# Patient Record
Sex: Male | Born: 1945 | Race: White | Hispanic: No | Marital: Single
Health system: Southern US, Community
[De-identification: ages and names within clinical notes are randomized; demographics above are authoritative.]

---

## 2017-02-01 ENCOUNTER — Inpatient Hospital Stay
Admission: EM | Admit: 2017-02-01 | Discharge: 2017-03-12 | Disposition: A | Discharge status: Skilled Nursing Facility | Payer: Self-pay | Source: Other Acute Inpatient Hospital | Attending: Internal Medicine | Admitting: Internal Medicine

## 2017-02-01 DIAGNOSIS — Z0189 Encounter for other specified special examinations: Secondary | ICD-10-CM

## 2017-02-01 DIAGNOSIS — J9600 Acute respiratory failure, unspecified whether with hypoxia or hypercapnia: Secondary | ICD-10-CM

## 2017-02-01 DIAGNOSIS — R0603 Acute respiratory distress: Secondary | ICD-10-CM

## 2017-02-01 DIAGNOSIS — Z431 Encounter for attention to gastrostomy: Secondary | ICD-10-CM

## 2017-02-01 DIAGNOSIS — Z4659 Encounter for fitting and adjustment of other gastrointestinal appliance and device: Secondary | ICD-10-CM

## 2017-02-01 DIAGNOSIS — R319 Hematuria, unspecified: Secondary | ICD-10-CM

## 2017-02-01 DIAGNOSIS — Z452 Encounter for adjustment and management of vascular access device: Secondary | ICD-10-CM

## 2017-02-01 DIAGNOSIS — R1310 Dysphagia, unspecified: Secondary | ICD-10-CM

## 2017-02-01 DIAGNOSIS — Z931 Gastrostomy status: Secondary | ICD-10-CM

## 2017-02-01 DIAGNOSIS — H6990 Unspecified Eustachian tube disorder, unspecified ear: Secondary | ICD-10-CM

## 2017-02-02 ENCOUNTER — Other Ambulatory Visit (HOSPITAL_COMMUNITY): Payer: Self-pay

## 2017-02-02 LAB — COMPREHENSIVE METABOLIC PANEL
ALBUMIN: 2.4 g/dL — AB (ref 3.5–5.0)
ALT: 6 U/L — ABNORMAL LOW (ref 17–63)
ANION GAP: 6 (ref 5–15)
AST: 35 U/L (ref 15–41)
Alkaline Phosphatase: 140 U/L — ABNORMAL HIGH (ref 38–126)
BUN: 20 mg/dL (ref 6–20)
CO2: 32 mmol/L (ref 22–32)
Calcium: 8.3 mg/dL — ABNORMAL LOW (ref 8.9–10.3)
Chloride: 113 mmol/L — ABNORMAL HIGH (ref 101–111)
Creatinine, Ser: 0.75 mg/dL (ref 0.61–1.24)
GFR calc Af Amer: >60 mL/min (ref >60)
GFR calc non Af Amer: >60 mL/min (ref >60)
GLUCOSE: 143 mg/dL — AB (ref 65–99)
POTASSIUM: 3.3 mmol/L — AB (ref 3.5–5.1)
SODIUM: 151 mmol/L — AB (ref 135–145)
TOTAL PROTEIN: 6.1 g/dL — AB (ref 6.5–8.1)
Total Bilirubin: 1.3 mg/dL — ABNORMAL HIGH (ref 0.3–1.2)

## 2017-02-02 LAB — CBC
HCT: 25.7 % — ABNORMAL LOW (ref 39.0–52.0)
Hemoglobin: 7.4 g/dL — ABNORMAL LOW (ref 13.0–17.0)
MCH: 28 pg (ref 26.0–34.0)
MCHC: 28.8 g/dL — ABNORMAL LOW (ref 30.0–36.0)
MCV: 97.3 fL (ref 78.0–100.0)
Platelets: 276 10*3/uL (ref 150–400)
RBC: 2.64 MIL/uL — AB (ref 4.22–5.81)
RDW: 16.4 % — ABNORMAL HIGH (ref 11.5–15.5)
WBC: 7.3 10*3/uL (ref 4.0–10.5)

## 2017-02-02 LAB — BRAIN NATRIURETIC PEPTIDE: B Natriuretic Peptide: 80.5 pg/mL (ref 0.0–100.0)

## 2017-02-04 LAB — BASIC METABOLIC PANEL
Anion gap: 9 (ref 5–15)
BUN: 22 mg/dL — AB (ref 6–20)
CHLORIDE: 111 mmol/L (ref 101–111)
CO2: 29 mmol/L (ref 22–32)
CREATININE: 0.76 mg/dL (ref 0.61–1.24)
Calcium: 8.1 mg/dL — ABNORMAL LOW (ref 8.9–10.3)
GFR calc Af Amer: >60 mL/min (ref >60)
GFR calc non Af Amer: >60 mL/min (ref >60)
GLUCOSE: 188 mg/dL — AB (ref 65–99)
Potassium: 2.9 mmol/L — ABNORMAL LOW (ref 3.5–5.1)
SODIUM: 149 mmol/L — AB (ref 135–145)

## 2017-02-04 LAB — CBC
HCT: 29 % — ABNORMAL LOW (ref 39.0–52.0)
Hemoglobin: 8.3 g/dL — ABNORMAL LOW (ref 13.0–17.0)
MCH: 28.2 pg (ref 26.0–34.0)
MCHC: 28.6 g/dL — AB (ref 30.0–36.0)
MCV: 98.6 fL (ref 78.0–100.0)
PLATELETS: 311 10*3/uL (ref 150–400)
RBC: 2.94 MIL/uL — ABNORMAL LOW (ref 4.22–5.81)
RDW: 17 % — AB (ref 11.5–15.5)
WBC: 10.5 10*3/uL (ref 4.0–10.5)

## 2017-02-06 ENCOUNTER — Encounter: Payer: Self-pay | Admitting: Anesthesiology

## 2017-02-06 ENCOUNTER — Other Ambulatory Visit (HOSPITAL_COMMUNITY): Payer: Self-pay

## 2017-02-06 LAB — BLOOD GAS, ARTERIAL
ACID-BASE EXCESS: 4.3 mmol/L — AB (ref 0.0–2.0)
Acid-Base Excess: 3.7 mmol/L — ABNORMAL HIGH (ref 0.0–2.0)
BICARBONATE: 26.9 mmol/L (ref 20.0–28.0)
Bicarbonate: 27.9 mmol/L (ref 20.0–28.0)
FIO2: 60
FIO2: 40
LHR: 18 {breaths}/min
MECHVT: 500 mL
O2 Content: 25 L/min
O2 SAT: 93.8 %
O2 SAT: 99.1 %
PATIENT TEMPERATURE: 103.7
PATIENT TEMPERATURE: 98.6
PCO2 ART: 35.4 mmHg (ref 32.0–48.0)
PCO2 ART: 43.5 mmHg (ref 32.0–48.0)
PEEP: 5 cmH2O
PH ART: 7.505 — AB (ref 7.350–7.450)
PH ART: 7.423 (ref 7.350–7.450)
PO2 ART: 74.1 mmHg — AB (ref 83.0–108.0)
PO2 ART: 139 mmHg — AB (ref 83.0–108.0)

## 2017-02-06 LAB — CBC
HCT: 25.1 % — ABNORMAL LOW (ref 39.0–52.0)
HEMOGLOBIN: 7.2 g/dL — AB (ref 13.0–17.0)
MCH: 29 pg (ref 26.0–34.0)
MCHC: 28.7 g/dL — AB (ref 30.0–36.0)
MCV: 101.2 fL — ABNORMAL HIGH (ref 78.0–100.0)
Platelets: 266 10*3/uL (ref 150–400)
RBC: 2.48 MIL/uL — AB (ref 4.22–5.81)
RDW: 17.6 % — ABNORMAL HIGH (ref 11.5–15.5)
WBC: 8.1 10*3/uL (ref 4.0–10.5)

## 2017-02-06 LAB — BLOOD CULTURE ID PANEL (REFLEXED)
Acinetobacter baumannii: NOT DETECTED
CANDIDA KRUSEI: NOT DETECTED
CARBAPENEM RESISTANCE: NOT DETECTED
Candida albicans: NOT DETECTED
Candida glabrata: NOT DETECTED
Candida parapsilosis: NOT DETECTED
Candida tropicalis: NOT DETECTED
ENTEROCOCCUS SPECIES: NOT DETECTED
Enterobacter cloacae complex: NOT DETECTED
Enterobacteriaceae species: DETECTED — AB
Escherichia coli: NOT DETECTED
HAEMOPHILUS INFLUENZAE: NOT DETECTED
KLEBSIELLA OXYTOCA: NOT DETECTED
Klebsiella pneumoniae: DETECTED — AB
LISTERIA MONOCYTOGENES: NOT DETECTED
NEISSERIA MENINGITIDIS: NOT DETECTED
PSEUDOMONAS AERUGINOSA: NOT DETECTED
Proteus species: NOT DETECTED
SERRATIA MARCESCENS: NOT DETECTED
STAPHYLOCOCCUS AUREUS BCID: NOT DETECTED
STAPHYLOCOCCUS SPECIES: NOT DETECTED
STREPTOCOCCUS PNEUMONIAE: NOT DETECTED
STREPTOCOCCUS PYOGENES: NOT DETECTED
STREPTOCOCCUS SPECIES: NOT DETECTED
Streptococcus agalactiae: NOT DETECTED

## 2017-02-06 LAB — LACTIC ACID, PLASMA: Lactic Acid, Venous: 2.1 mmol/L (ref 0.5–1.9)

## 2017-02-06 LAB — BASIC METABOLIC PANEL
Anion gap: 5 (ref 5–15)
BUN: 26 mg/dL — ABNORMAL HIGH (ref 6–20)
CHLORIDE: 120 mmol/L — AB (ref 101–111)
CO2: 27 mmol/L (ref 22–32)
CREATININE: 0.73 mg/dL (ref 0.61–1.24)
Calcium: 7.7 mg/dL — ABNORMAL LOW (ref 8.9–10.3)
GFR calc Af Amer: >60 mL/min (ref >60)
Glucose, Bld: 201 mg/dL — ABNORMAL HIGH (ref 65–99)
Potassium: 3.6 mmol/L (ref 3.5–5.1)
SODIUM: 152 mmol/L — AB (ref 135–145)

## 2017-02-06 LAB — URINALYSIS, ROUTINE W REFLEX MICROSCOPIC
Bilirubin Urine: NEGATIVE
Glucose, UA: NEGATIVE mg/dL
KETONES UR: NEGATIVE mg/dL
NITRITE: POSITIVE — AB
PROTEIN: 30 mg/dL — AB
Specific Gravity, Urine: 1.027 (ref 1.005–1.030)
pH: 7 (ref 5.0–8.0)

## 2017-02-06 MED ORDER — PROPOFOL 10 MG/ML IV BOLUS
INTRAVENOUS | Status: DC | PRN
  Administered 2017-02-06: 80 mg via INTRAVENOUS

## 2017-02-06 NOTE — Anesthesia Procedure Notes (Signed)
Procedure Name: Intubation Date/Time: 02/06/2017 6:57 AM Performed by: Claudina LickMahony, Jeanne Diefendorf D, CRNA Pre-anesthesia Checklist: Patient identified, Emergency Drugs available, Suction available, Patient being monitored and Timeout performed Patient Re-evaluated:Patient Re-evaluated prior to induction Oxygen Delivery Method: Non-rebreather mask Preoxygenation: Pre-oxygenation with 100% oxygen (Cervical collar in situ. Head/neck maintained in neutral position) Induction Type: IV induction Laryngoscope Size: Glidescope Grade View: Grade I Tube type: Subglottic suction tube Tube size: 7.5 mm Number of attempts: 1 Airway Equipment and Method: Stylet Placement Confirmation: ETT inserted through vocal cords under direct vision,  CO2 detector and breath sounds checked- equal and bilateral Secured at: 23 cm Tube secured with: Tape Dental Injury: Teeth and Oropharynx as per pre-operative assessment

## 2017-02-06 NOTE — Transfer of Care (Signed)
Immediate Anesthesia Transfer of Care Note  Patient: Mark Douglas  Procedure(s) Performed: AN AD HOC INTUBATION  Patient Location: Select unit  Anesthesia Type:General  Level of Consciousness: obtunded  Airway & Oxygen Therapy: Patient remains intubated per anesthesia plan and Patient placed on Ventilator (see vital sign flow sheet for setting)  Post-op Assessment: Report given to RN and Post -op Vital signs reviewed and stable  Post vital signs: Reviewed and stable  Last Vitals: There were no vitals filed for this visit.  Last Pain: There were no vitals filed for this visit.       Complications: No apparent anesthesia complications

## 2017-02-07 ENCOUNTER — Other Ambulatory Visit (HOSPITAL_COMMUNITY): Payer: Self-pay

## 2017-02-07 LAB — VANCOMYCIN, TROUGH: VANCOMYCIN TR: 15 ug/mL (ref 15–20)

## 2017-02-08 ENCOUNTER — Other Ambulatory Visit (HOSPITAL_COMMUNITY): Payer: Self-pay

## 2017-02-08 LAB — URINE CULTURE: Culture: >=100000 — AB

## 2017-02-08 LAB — POTASSIUM: Potassium: 3.6 mmol/L (ref 3.5–5.1)
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
## 2017-02-09 ENCOUNTER — Encounter: Payer: Self-pay | Admitting: Certified Registered"

## 2017-02-09 ENCOUNTER — Other Ambulatory Visit (HOSPITAL_COMMUNITY): Payer: Self-pay

## 2017-02-09 ENCOUNTER — Encounter (HOSPITAL_COMMUNITY): Payer: Self-pay | Admitting: Anesthesiology

## 2017-02-09 ENCOUNTER — Encounter
Admission: EM | Disposition: A | Discharge status: Skilled Nursing Facility | Payer: Self-pay | Source: Other Acute Inpatient Hospital | Attending: Internal Medicine

## 2017-02-09 HISTORY — PX: TRACHEOSTOMY TUBE PLACEMENT: SHX814

## 2017-02-09 LAB — CULTURE, BLOOD (ROUTINE X 2)

## 2017-02-09 LAB — CBC
HCT: 26.8 % — ABNORMAL LOW (ref 39.0–52.0)
Hemoglobin: 7.2 g/dL — ABNORMAL LOW (ref 13.0–17.0)
MCH: 27.5 pg (ref 26.0–34.0)
MCHC: 26.9 g/dL — AB (ref 30.0–36.0)
MCV: 102.3 fL — ABNORMAL HIGH (ref 78.0–100.0)
PLATELETS: 386 10*3/uL (ref 150–400)
RBC: 2.62 MIL/uL — ABNORMAL LOW (ref 4.22–5.81)
RDW: 16.5 % — AB (ref 11.5–15.5)
WBC: 9.9 10*3/uL (ref 4.0–10.5)

## 2017-02-09 LAB — CULTURE, RESPIRATORY W GRAM STAIN

## 2017-02-09 LAB — BASIC METABOLIC PANEL
ANION GAP: 11 (ref 5–15)
BUN: 21 mg/dL — ABNORMAL HIGH (ref 6–20)
CALCIUM: 8.1 mg/dL — AB (ref 8.9–10.3)
CO2: 23 mmol/L (ref 22–32)
CREATININE: 0.78 mg/dL (ref 0.61–1.24)
Chloride: 123 mmol/L — ABNORMAL HIGH (ref 101–111)
GFR calc Af Amer: >60 mL/min (ref >60)
Glucose, Bld: 134 mg/dL — ABNORMAL HIGH (ref 65–99)
Potassium: 3.7 mmol/L (ref 3.5–5.1)
Sodium: 157 mmol/L — ABNORMAL HIGH (ref 135–145)

## 2017-02-09 LAB — CULTURE, RESPIRATORY

## 2017-02-09 SURGERY — CREATION, TRACHEOSTOMY
Anesthesia: General | Site: Neck

## 2017-02-09 MED ORDER — PHENYLEPHRINE HCL 10 MG/ML IJ SOLN
INTRAVENOUS | Status: DC | PRN
  Administered 2017-02-09: 50 ug/min via INTRAVENOUS

## 2017-02-09 MED ORDER — FENTANYL CITRATE (PF) 100 MCG/2ML IJ SOLN
INTRAMUSCULAR | Status: DC | PRN
  Administered 2017-02-09: 50 ug via INTRAVENOUS
  Administered 2017-02-09 (×2): 25 ug via INTRAVENOUS
  Administered 2017-02-09: 50 ug via INTRAVENOUS

## 2017-02-09 MED ORDER — PHENYLEPHRINE HCL 10 MG/ML IJ SOLN
INTRAMUSCULAR | Status: DC | PRN
  Administered 2017-02-09: 80 ug via INTRAVENOUS

## 2017-02-09 MED ORDER — LIDOCAINE-EPINEPHRINE 1 %-1:100000 IJ SOLN
INTRAMUSCULAR | Status: DC | PRN
  Administered 2017-02-09: 7 mL

## 2017-02-09 MED ORDER — ROCURONIUM BROMIDE 100 MG/10ML IV SOLN
INTRAVENOUS | Status: DC | PRN
  Administered 2017-02-09: 50 mg via INTRAVENOUS

## 2017-02-09 MED ORDER — LACTATED RINGERS IV SOLN
INTRAVENOUS | Status: DC | PRN
  Administered 2017-02-09: 08:00:00 via INTRAVENOUS

## 2017-02-09 MED ORDER — PROPOFOL 10 MG/ML IV BOLUS
INTRAVENOUS | Status: AC
  Filled 2017-02-09: qty 20

## 2017-02-09 MED ORDER — FENTANYL CITRATE (PF) 250 MCG/5ML IJ SOLN
INTRAMUSCULAR | Status: AC
  Filled 2017-02-09: qty 5

## 2017-02-09 MED ORDER — 0.9 % SODIUM CHLORIDE (POUR BTL) OPTIME
TOPICAL | Status: DC | PRN
  Administered 2017-02-09: 1000 mL

## 2017-02-09 SURGICAL SUPPLY — 42 items
ATTRACTOMAT 16X20 MAGNETIC DRP (DRAPES) ×3 IMPLANT
BLADE SURG 15 STRL LF DISP TIS (BLADE) ×1 IMPLANT
BLADE SURG 15 STRL SS (BLADE) ×2
CLEANER TIP ELECTROSURG 2X2 (MISCELLANEOUS) ×3 IMPLANT
COVER SURGICAL LIGHT HANDLE (MISCELLANEOUS) ×3 IMPLANT
DRAPE HALF SHEET 40X57 (DRAPES) ×3 IMPLANT
ELECT COATED BLADE 2.86 ST (ELECTRODE) ×3 IMPLANT
ELECT REM PT RETURN 9FT ADLT (ELECTROSURGICAL) ×3
ELECTRODE REM PT RTRN 9FT ADLT (ELECTROSURGICAL) ×1 IMPLANT
GAUZE SPONGE 4X4 16PLY XRAY LF (GAUZE/BANDAGES/DRESSINGS) ×3 IMPLANT
GEL ULTRASOUND 20GR AQUASONIC (MISCELLANEOUS) ×3 IMPLANT
GLOVE BIOGEL PI IND STRL 7.0 (GLOVE) ×2 IMPLANT
GLOVE BIOGEL PI INDICATOR 7.0 (GLOVE) ×4
GLOVE SS BIOGEL STRL SZ 7.5 (GLOVE) ×1 IMPLANT
GLOVE SUPERSENSE BIOGEL SZ 7.5 (GLOVE) ×2
GLOVE SURG SS PI 6.5 STRL IVOR (GLOVE) ×6 IMPLANT
GLOVE SURG SS PI 7.5 STRL IVOR (GLOVE) ×3 IMPLANT
GOWN STRL REUS W/ TWL LRG LVL3 (GOWN DISPOSABLE) ×1 IMPLANT
GOWN STRL REUS W/ TWL XL LVL3 (GOWN DISPOSABLE) ×1 IMPLANT
GOWN STRL REUS W/TWL LRG LVL3 (GOWN DISPOSABLE) ×2
GOWN STRL REUS W/TWL XL LVL3 (GOWN DISPOSABLE) ×2
HOLDER TRACH TUBE VELCRO 19.5 (MISCELLANEOUS) ×3 IMPLANT
KIT BASIN OR (CUSTOM PROCEDURE TRAY) ×3 IMPLANT
KIT ROOM TURNOVER OR (KITS) ×3 IMPLANT
KIT SUCTION CATH 14FR (SUCTIONS) ×3 IMPLANT
NEEDLE HYPO 25GX1X1/2 BEV (NEEDLE) ×3 IMPLANT
NS IRRIG 1000ML POUR BTL (IV SOLUTION) ×3 IMPLANT
PACK EENT II TURBAN DRAPE (CUSTOM PROCEDURE TRAY) ×3 IMPLANT
PAD ARMBOARD 7.5X6 YLW CONV (MISCELLANEOUS) ×3 IMPLANT
PENCIL BUTTON HOLSTER BLD 10FT (ELECTRODE) ×3 IMPLANT
SPONGE DRAIN TRACH 4X4 STRL 2S (GAUZE/BANDAGES/DRESSINGS) ×3 IMPLANT
SPONGE INTESTINAL PEANUT (DISPOSABLE) ×3 IMPLANT
SUT SILK 2 0 SH CR/8 (SUTURE) ×3 IMPLANT
SUT SILK 3 0 TIES 10X30 (SUTURE) IMPLANT
SYR 5ML LUER SLIP (SYRINGE) ×3 IMPLANT
SYR CONTROL 10ML LL (SYRINGE) ×3 IMPLANT
TOWEL OR 17X24 6PK STRL BLUE (TOWEL DISPOSABLE) ×3 IMPLANT
TOWEL OR 17X26 10 PK STRL BLUE (TOWEL DISPOSABLE) ×3 IMPLANT
TUBE CONNECTING 12'X1/4 (SUCTIONS) ×1
TUBE CONNECTING 12X1/4 (SUCTIONS) ×2 IMPLANT
TUBE TRACH SHILEY  6 DIST  CUF (TUBING) ×3 IMPLANT
TUBE TRACH SHILEY 8 DIST CUF (TUBING) IMPLANT

## 2017-02-09 NOTE — OR Nursing (Signed)
Changed patient bed linen. Gave patient a bath. Inserted foley per Dr. Ezzard StandingNewman. Re-dressed the ace wrap to bilateral legs. Replaced C-Collar with new one.

## 2017-02-09 NOTE — Anesthesia Procedure Notes (Addendum)
Date/Time: 02/09/2017 7:57 AM Performed by: Caren Macadamarter, Cherlyn Syring W, CRNA Pre-anesthesia Checklist: Patient identified, Emergency Drugs available, Suction available, Patient being monitored and Timeout performed Patient Re-evaluated:Patient Re-evaluated prior to induction Oxygen Delivery Method: Circle system utilized Preoxygenation: Pre-oxygenation with 100% oxygen Induction Type: Inhalational induction with existing ETT Tube size: 7.5 mm Placement Confirmation: positive ETCO2

## 2017-02-09 NOTE — Transfer of Care (Signed)
Immediate Anesthesia Transfer of Care Note  Patient: Mark Douglas  Procedure(s) Performed: TRACHEOSTOMY (N/A Neck)  Patient Location: select  Anesthesia Type:General  Level of Consciousness: drowsy  Airway & Oxygen Therapy: Patient placed on Ventilator (see vital sign flow sheet for setting)  Post-op Assessment: Report given to RN and Post -op Vital signs reviewed and stable  Post vital signs: Reviewed and stable  Last Vitals: There were no vitals filed for this visit.  Last Pain: There were no vitals filed for this visit.       Complications: No apparent anesthesia complications

## 2017-02-09 NOTE — Brief Op Note (Signed)
02/09/2017  9:58 AM  PATIENT:  Estrella MyrtleWallace Jock  72 y.o. male  PRE-OPERATIVE DIAGNOSIS:  respiratory failure  POST-OPERATIVE DIAGNOSIS:  respiratory failure  PROCEDURE:  Procedure(s): TRACHEOSTOMY (N/A) # 6 Shiley  SURGEON:  Surgeon(s) and Role:    * Drema HalonNewman, Christopher E, MD - Primary  PHYSICIAN ASSISTANT:   ASSISTANTS: none   ANESTHESIA:   general  EBL:  5 mL   BLOOD ADMINISTERED:none  DRAINS: none   LOCAL MEDICATIONS USED:  XYLOCAINE with epi 6 cc  SPECIMEN:  No Specimen  DISPOSITION OF SPECIMEN:  N/A  COUNTS:  YES  TOURNIQUET:  * No tourniquets in log *  DICTATION: .Other Dictation: Dictation Number [id number]  PLAN OF CARE: Discharge to home after PACU  PATIENT DISPOSITION:  PACU - hemodynamically stable.   Delay start of Pharmacological VTE agent (>24hrs) due to surgical blood loss or risk of bleeding: yes
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
## 2017-02-11 LAB — C DIFFICILE QUICK SCREEN W PCR REFLEX
C DIFFICLE (CDIFF) ANTIGEN: POSITIVE — AB
C Diff toxin: NEGATIVE

## 2017-02-11 LAB — CLOSTRIDIUM DIFFICILE BY PCR, REFLEXED: Toxigenic C. Difficile by PCR: NEGATIVE

## 2017-02-12 ENCOUNTER — Encounter (HOSPITAL_COMMUNITY): Payer: Self-pay | Admitting: Otolaryngology

## 2017-02-12 ENCOUNTER — Other Ambulatory Visit (HOSPITAL_COMMUNITY): Payer: Self-pay
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
## 2017-02-13 ENCOUNTER — Encounter: Payer: Self-pay | Admitting: General Surgery

## 2017-02-13 ENCOUNTER — Other Ambulatory Visit (HOSPITAL_COMMUNITY): Payer: Self-pay

## 2017-02-13 LAB — CBC
HCT: 19.6 % — ABNORMAL LOW (ref 39.0–52.0)
Hemoglobin: 5.9 g/dL — CL (ref 13.0–17.0)
MCH: 27.8 pg (ref 26.0–34.0)
MCHC: 30.1 g/dL (ref 30.0–36.0)
MCV: 92.5 fL (ref 78.0–100.0)
PLATELETS: 177 10*3/uL (ref 150–400)
RBC: 2.12 MIL/uL — AB (ref 4.22–5.81)
RDW: 15.6 % — AB (ref 11.5–15.5)
WBC: 6.7 10*3/uL (ref 4.0–10.5)

## 2017-02-13 LAB — ABO/RH: ABO/RH(D): O NEG

## 2017-02-13 LAB — BASIC METABOLIC PANEL
Anion gap: 10 (ref 5–15)
BUN: 12 mg/dL (ref 6–20)
CALCIUM: 7.6 mg/dL — AB (ref 8.9–10.3)
CHLORIDE: 105 mmol/L (ref 101–111)
CO2: 25 mmol/L (ref 22–32)
CREATININE: 0.64 mg/dL (ref 0.61–1.24)
GFR calc non Af Amer: >60 mL/min (ref >60)
GLUCOSE: 164 mg/dL — AB (ref 65–99)
Potassium: 3.6 mmol/L (ref 3.5–5.1)
Sodium: 140 mmol/L (ref 135–145)

## 2017-02-13 LAB — PREPARE RBC (CROSSMATCH)
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
## 2017-02-14 ENCOUNTER — Other Ambulatory Visit (HOSPITAL_COMMUNITY): Payer: Self-pay

## 2017-02-14 ENCOUNTER — Encounter (HOSPITAL_COMMUNITY): Payer: Self-pay | Admitting: Interventional Radiology

## 2017-02-14 HISTORY — PX: IR GASTROSTOMY TUBE MOD SED: IMG625

## 2017-02-14 LAB — HEMOGLOBIN A1C
Hgb A1c MFr Bld: 5.1 % (ref 4.8–5.6)
Mean Plasma Glucose: 99.67 mg/dL

## 2017-02-14 LAB — CBC
HCT: 24.6 % — ABNORMAL LOW (ref 39.0–52.0)
HEMATOCRIT: 28.4 % — AB (ref 39.0–52.0)
HEMOGLOBIN: 8.7 g/dL — AB (ref 13.0–17.0)
Hemoglobin: 7.7 g/dL — ABNORMAL LOW (ref 13.0–17.0)
MCH: 28.3 pg (ref 26.0–34.0)
MCH: 28.4 pg (ref 26.0–34.0)
MCHC: 30.6 g/dL (ref 30.0–36.0)
MCHC: 31.3 g/dL (ref 30.0–36.0)
MCV: 92.5 fL (ref 78.0–100.0)
MCV: 90.8 fL (ref 78.0–100.0)
PLATELETS: 195 10*3/uL (ref 150–400)
Platelets: 211 10*3/uL (ref 150–400)
RBC: 3.07 MIL/uL — ABNORMAL LOW (ref 4.22–5.81)
RBC: 2.71 MIL/uL — AB (ref 4.22–5.81)
RDW: 16.1 % — AB (ref 11.5–15.5)
RDW: 16 % — AB (ref 11.5–15.5)
WBC: 7 10*3/uL (ref 4.0–10.5)
WBC: 5.9 10*3/uL (ref 4.0–10.5)

## 2017-02-14 LAB — TYPE AND SCREEN
ABO/RH(D): O NEG
Antibody Screen: NEGATIVE
UNIT DIVISION: 0

## 2017-02-14 LAB — BASIC METABOLIC PANEL
Anion gap: 11 (ref 5–15)
BUN: 10 mg/dL (ref 6–20)
CALCIUM: 7.6 mg/dL — AB (ref 8.9–10.3)
CO2: 26 mmol/L (ref 22–32)
Chloride: 103 mmol/L (ref 101–111)
Creatinine, Ser: 0.51 mg/dL — ABNORMAL LOW (ref 0.61–1.24)
GFR calc Af Amer: >60 mL/min (ref >60)
Glucose, Bld: 110 mg/dL — ABNORMAL HIGH (ref 65–99)
POTASSIUM: 2.9 mmol/L — AB (ref 3.5–5.1)
SODIUM: 140 mmol/L (ref 135–145)

## 2017-02-14 LAB — PROTIME-INR
INR: 1.26
PROTHROMBIN TIME: 15.7 s — AB (ref 11.4–15.2)

## 2017-02-14 LAB — BPAM RBC
Blood Product Expiration Date: 201901262359
ISSUE DATE / TIME: 201901151729
UNIT TYPE AND RH: 9500

## 2017-02-14 MED ORDER — MIDAZOLAM HCL 2 MG/2ML IJ SOLN
INTRAMUSCULAR | Status: AC
  Filled 2017-02-14: qty 4

## 2017-02-14 MED ORDER — VANCOMYCIN HCL IN DEXTROSE 1-5 GM/200ML-% IV SOLN
INTRAVENOUS | Status: AC
  Filled 2017-02-14: qty 200

## 2017-02-14 MED ORDER — IOPAMIDOL (ISOVUE-300) INJECTION 61%
INTRAVENOUS | Status: AC
  Administered 2017-02-14: 15 mL
  Filled 2017-02-14: qty 50

## 2017-02-14 MED ORDER — FENTANYL CITRATE (PF) 100 MCG/2ML IJ SOLN
INTRAMUSCULAR | Status: AC
  Filled 2017-02-14: qty 4

## 2017-02-14 MED ORDER — CEFAZOLIN (ANCEF) 1 G IV SOLR: 2.0000 g | INTRAVENOUS | Status: DC

## 2017-02-14 MED ORDER — MIDAZOLAM HCL 2 MG/2ML IJ SOLN
INTRAMUSCULAR | Status: AC | PRN
  Administered 2017-02-14: 1 mg via INTRAVENOUS
  Administered 2017-02-14: 0.5 mg via INTRAVENOUS

## 2017-02-14 MED ORDER — LIDOCAINE HCL 1 % IJ SOLN
INTRAMUSCULAR | Status: AC
  Filled 2017-02-14: qty 20

## 2017-02-14 MED ORDER — LIDOCAINE HCL (PF) 1 % IJ SOLN
INTRAMUSCULAR | Status: AC | PRN
  Administered 2017-02-14: 10 mL

## 2017-02-14 MED ORDER — GLUCAGON HCL (RDNA) 1 MG IJ SOLR
INTRAMUSCULAR | Status: AC | PRN
  Administered 2017-02-14: 1 mg via INTRAVENOUS

## 2017-02-14 MED ORDER — FENTANYL CITRATE (PF) 100 MCG/2ML IJ SOLN
INTRAMUSCULAR | Status: AC | PRN
  Administered 2017-02-14: 50 ug via INTRAVENOUS

## 2017-02-14 MED ORDER — GLUCAGON HCL RDNA (DIAGNOSTIC) 1 MG IJ SOLR
INTRAMUSCULAR | Status: AC
  Filled 2017-02-14: qty 1

## 2017-02-14 MED ORDER — CEFAZOLIN SODIUM-DEXTROSE 2-4 GM/100ML-% IV SOLN
INTRAVENOUS | Status: AC
  Administered 2017-02-14: 2 g via INTRAVENOUS
  Filled 2017-02-14: qty 100

## 2017-02-14 MED ORDER — CEFAZOLIN SODIUM-DEXTROSE 2-4 GM/100ML-% IV SOLN
2.0000 g | Freq: Once | INTRAVENOUS | Status: AC
Start: 2017-02-14 — End: 2017-02-14
  Administered 2017-02-14: 2 g via INTRAVENOUS

## 2017-02-14 NOTE — Sedation Documentation (Signed)
Patient is resting comfortably. 

## 2017-02-14 NOTE — Sedation Documentation (Signed)
Patient face scale 0/10 pain  

## 2017-02-14 NOTE — Procedures (Signed)
Pre procedure Dx: Dysphagia Post Procedure Dx: Same  Successful fluoroscopic guided insertion of gastrostomy tube.   The gastrostomy tube may be used immediately for medications.   Tube feeds may be initiated in 24 hours as per the primary team.    EBL: Minimal  Complications: None immediate  Jay Lanell Carpenter, MD Pager #: [phone]    

## 2017-02-14 NOTE — Anesthesia Postprocedure Evaluation (Signed)
Anesthesia Post Note  Patient: Mark Douglas  Procedure(s) Performed: TRACHEOSTOMY (N/A Neck)     Patient location during evaluation: Nursing Unit Anesthesia Type: General Level of consciousness: awake and alert (still non-communicative with me, but very alert, watching TV) Pain management: pain level controlled Vital Signs Assessment: post-procedure vital signs reviewed and stable Respiratory status: spontaneous breathing, nonlabored ventilation, respiratory function stable and patient connected to tracheostomy mask oxygen (has weaned from vent) Cardiovascular status: blood pressure returned to baseline and stable Postop Assessment: no apparent nausea or vomiting Anesthetic complications: no Comments: Pt doing much better    Last Vitals:  Vitals:   02/14/17 1025 02/14/17 1035  BP: (!) 143/76 (!) 148/70  Pulse: 92 92  Resp: 19 19  SpO2: 95% 95%    Last Pain: There were no vitals filed for this visit.               Germaine PomfretJACKSON,E. Faust Thorington

## 2017-02-15 LAB — POTASSIUM: POTASSIUM: 3.6 mmol/L (ref 3.5–5.1)

## 2017-02-16 LAB — CBC
HEMATOCRIT: 29 % — AB (ref 39.0–52.0)
Hemoglobin: 8.9 g/dL — ABNORMAL LOW (ref 13.0–17.0)
MCH: 27.9 pg (ref 26.0–34.0)
MCHC: 30.7 g/dL (ref 30.0–36.0)
MCV: 90.9 fL (ref 78.0–100.0)
Platelets: 289 10*3/uL (ref 150–400)
RBC: 3.19 MIL/uL — ABNORMAL LOW (ref 4.22–5.81)
RDW: 15.4 % (ref 11.5–15.5)
WBC: 13 10*3/uL — ABNORMAL HIGH (ref 4.0–10.5)

## 2017-02-16 LAB — BASIC METABOLIC PANEL
Anion gap: 14 (ref 5–15)
BUN: 11 mg/dL (ref 6–20)
CALCIUM: 8.3 mg/dL — AB (ref 8.9–10.3)
CO2: 24 mmol/L (ref 22–32)
Chloride: 102 mmol/L (ref 101–111)
Creatinine, Ser: 0.65 mg/dL (ref 0.61–1.24)
GFR calc Af Amer: >60 mL/min (ref >60)
GFR calc non Af Amer: >60 mL/min (ref >60)
GLUCOSE: 154 mg/dL — AB (ref 65–99)
Potassium: 3 mmol/L — ABNORMAL LOW (ref 3.5–5.1)
Sodium: 140 mmol/L (ref 135–145)

## 2017-02-16 LAB — PROTIME-INR
INR: 1.2
Prothrombin Time: 15.1 seconds (ref 11.4–15.2)

## 2017-02-17 ENCOUNTER — Other Ambulatory Visit (HOSPITAL_COMMUNITY): Payer: Self-pay

## 2017-02-17 LAB — CBC WITH DIFFERENTIAL/PLATELET
BASOS ABS: 0 10*3/uL (ref 0.0–0.1)
Basophils Relative: 0 %
Eosinophils Absolute: 0 10*3/uL (ref 0.0–0.7)
Eosinophils Relative: 0 %
HEMATOCRIT: 25.7 % — AB (ref 39.0–52.0)
HEMOGLOBIN: 7.6 g/dL — AB (ref 13.0–17.0)
Lymphocytes Relative: 7 %
Lymphs Abs: 0.6 10*3/uL — ABNORMAL LOW (ref 0.7–4.0)
MCH: 27.3 pg (ref 26.0–34.0)
MCHC: 29.6 g/dL — ABNORMAL LOW (ref 30.0–36.0)
MCV: 92.4 fL (ref 78.0–100.0)
Monocytes Absolute: 0.6 10*3/uL (ref 0.1–1.0)
Monocytes Relative: 7 %
NEUTROS ABS: 7.3 10*3/uL (ref 1.7–7.7)
NEUTROS PCT: 86 %
Platelets: 230 10*3/uL (ref 150–400)
RBC: 2.78 MIL/uL — AB (ref 4.22–5.81)
RDW: 15.9 % — ABNORMAL HIGH (ref 11.5–15.5)
WBC: 8.6 10*3/uL (ref 4.0–10.5)

## 2017-02-17 LAB — BASIC METABOLIC PANEL
ANION GAP: 10 (ref 5–15)
BUN: 13 mg/dL (ref 6–20)
CALCIUM: 8.1 mg/dL — AB (ref 8.9–10.3)
CHLORIDE: 101 mmol/L (ref 101–111)
CO2: 28 mmol/L (ref 22–32)
Creatinine, Ser: 0.67 mg/dL (ref 0.61–1.24)
GFR calc non Af Amer: >60 mL/min (ref >60)
Glucose, Bld: 134 mg/dL — ABNORMAL HIGH (ref 65–99)
POTASSIUM: 3.2 mmol/L — AB (ref 3.5–5.1)
Sodium: 139 mmol/L (ref 135–145)

## 2017-02-17 LAB — URINALYSIS, ROUTINE W REFLEX MICROSCOPIC
BACTERIA UA: NONE SEEN
BILIRUBIN URINE: NEGATIVE
Glucose, UA: NEGATIVE mg/dL
Ketones, ur: NEGATIVE mg/dL
NITRITE: NEGATIVE
Protein, ur: 100 mg/dL — AB
SPECIFIC GRAVITY, URINE: 1.015 (ref 1.005–1.030)
Squamous Epithelial / LPF: NONE SEEN
WBC UA: NONE SEEN WBC/hpf (ref 0–5)
pH: 7 (ref 5.0–8.0)

## 2017-02-18 LAB — URINE CULTURE: CULTURE: NO GROWTH

## 2017-02-19 ENCOUNTER — Encounter (HOSPITAL_COMMUNITY): Payer: Self-pay | Admitting: Interventional Radiology

## 2017-02-19 ENCOUNTER — Other Ambulatory Visit (HOSPITAL_COMMUNITY): Payer: Self-pay

## 2017-02-19 HISTORY — PX: IR FLUORO RM 30-60 MIN: IMG2384

## 2017-02-19 MED ORDER — LIDOCAINE VISCOUS 2 % MT SOLN
OROMUCOSAL | Status: AC
  Filled 2017-02-19: qty 15

## 2017-02-19 MED ORDER — IOPAMIDOL (ISOVUE-300) INJECTION 61%
INTRAVENOUS | Status: AC
  Administered 2017-02-19: 5 mL
  Filled 2017-02-19: qty 50

## 2017-02-20 ENCOUNTER — Other Ambulatory Visit (HOSPITAL_COMMUNITY): Payer: Self-pay

## 2017-02-22 LAB — CBC
HCT: 27.9 % — ABNORMAL LOW (ref 39.0–52.0)
Hemoglobin: 8.2 g/dL — ABNORMAL LOW (ref 13.0–17.0)
MCH: 27.7 pg (ref 26.0–34.0)
MCHC: 29.4 g/dL — ABNORMAL LOW (ref 30.0–36.0)
MCV: 94.3 fL (ref 78.0–100.0)
PLATELETS: 263 10*3/uL (ref 150–400)
RBC: 2.96 MIL/uL — ABNORMAL LOW (ref 4.22–5.81)
RDW: 16.8 % — AB (ref 11.5–15.5)
WBC: 5.4 10*3/uL (ref 4.0–10.5)

## 2017-02-22 LAB — BASIC METABOLIC PANEL
Anion gap: 9 (ref 5–15)
BUN: 7 mg/dL (ref 6–20)
CALCIUM: 8 mg/dL — AB (ref 8.9–10.3)
CO2: 24 mmol/L (ref 22–32)
Chloride: 101 mmol/L (ref 101–111)
Creatinine, Ser: 0.54 mg/dL — ABNORMAL LOW (ref 0.61–1.24)
GFR calc Af Amer: >60 mL/min (ref >60)
GFR calc non Af Amer: >60 mL/min (ref >60)
GLUCOSE: 114 mg/dL — AB (ref 65–99)
Potassium: 3.7 mmol/L (ref 3.5–5.1)
Sodium: 134 mmol/L — ABNORMAL LOW (ref 135–145)

## 2017-02-26 ENCOUNTER — Other Ambulatory Visit (HOSPITAL_COMMUNITY): Payer: Self-pay

## 2017-02-27 LAB — CBC
HEMATOCRIT: 33.4 % — AB (ref 39.0–52.0)
Hemoglobin: 10.2 g/dL — ABNORMAL LOW (ref 13.0–17.0)
MCH: 28.3 pg (ref 26.0–34.0)
MCHC: 30.5 g/dL (ref 30.0–36.0)
MCV: 92.8 fL (ref 78.0–100.0)
Platelets: 222 10*3/uL (ref 150–400)
RBC: 3.6 MIL/uL — ABNORMAL LOW (ref 4.22–5.81)
RDW: 16 % — ABNORMAL HIGH (ref 11.5–15.5)
WBC: 7.2 10*3/uL (ref 4.0–10.5)

## 2017-02-27 LAB — PROTIME-INR
INR: 1.16
Prothrombin Time: 14.7 seconds (ref 11.4–15.2)

## 2017-02-28 NOTE — Progress Notes (Signed)
Referring Physician(s): Dr. Luna KitchensStephanie Douglas  Supervising Physician: Mark Douglas, Mark Douglas  [name]  Chief Complaint: dysphagia  Subjective: Patient known to IR service for g-tube placement.  This was pulled out and reattempt at placement failed.  This site has healed and a new request has been made for another g-tube.  Patient is significantly more alert and cooperative than he was 2 weeks ago.  Allergies: Patient has no allergy information on record.  Medications: Prior to Admission medications   Not on File    Vital Signs: BP (!) 148/70   Pulse 92   Resp 19   SpO2 95%   Physical Exam: Gen: pleasant, asks some appropriate questions Heart: regular Lungs: coarse breath sounds, trach in place, but capped Abd: site is healed, NT, ND, +BS  Imaging: Ct Abdomen Pelvis W Contrast  Result Date: 02/27/2017 CLINICAL DATA:  Abdominal pain and fever. Status post placement of percutaneous gastrostomy tube on 02/14/2017. The tube then was accidentally pulled out 5 days after placement and there was an unsuccessful attempt to replace the tube through the pre-existing tract on 02/19/2017. EXAM: CT ABDOMEN AND PELVIS WITH CONTRAST TECHNIQUE: Multidetector CT imaging of the abdomen and pelvis was performed using the standard protocol following bolus administration of intravenous contrast. CONTRAST:  100 mL Isovue-300 IV COMPARISON:  CT of the abdomen without contrast on 02/13/2017 FINDINGS: Lower chest: Bibasilar atelectasis/airspace disease present. Hepatobiliary: No focal liver abnormality is seen. No gallstones, gallbladder wall thickening, or biliary dilatation. Pancreas: Unremarkable. No pancreatic ductal dilatation or surrounding inflammatory changes. Spleen: Normal in size without focal abnormality. Adrenals/Urinary Tract: Adrenal glands are unremarkable. Kidneys are normal, without renal calculi, focal lesion, or hydronephrosis. Bladder is unremarkable. Stomach/Bowel: Nasogastric  tube extends into a decompressed stomach. Thin soft tissue density corresponding to the old gastrostomy tube tract is seen in the abdominal wall and to the level of the stomach. There is no evidence of abscess or extraluminal air. Other bowel has a normal appearance without evidence of obstruction, ileus, inflammation or lesion. No ascites identified in the peritoneal cavity. Vascular/Lymphatic: No significant vascular findings are present. No enlarged abdominal or pelvic lymph nodes. Reproductive: Prostate is unremarkable. Other: Small bilateral inguinal hernias contain fat. Musculoskeletal: No bony lesions. Prior fixation across the anterior aspect of the left SI joint. IMPRESSION: 1. No abscess or inflammatory process seen in the abdomen or pelvis. Thin soft tissue density is seen corresponding to the old gastrostomy tube tract. No evidence of bowel perforation or obstruction. 2. Bibasilar atelectasis/airspace disease in the visualized lung bases. Electronically Signed   By: Mark Douglas  Mark Douglas.   On: 02/27/2017 08:14    Labs:  CBC: Recent Labs    02/16/17 1221 02/17/17 0805 02/22/17 0730 02/27/17 0654  WBC 13.0* 8.6 5.4 7.2  HGB 8.9* 7.6* 8.2* 10.2*  HCT 29.0* 25.7* 27.9* 33.4*  PLT 289 230 263 222    COAGS: Recent Labs    02/14/17 0741 02/16/17 1221 02/27/17 0654  INR 1.26 1.20 1.16    BMP: Recent Labs    02/14/17 0741 02/15/17 1213 02/16/17 1221 02/17/17 0805 02/22/17 0730  NA 140  --  140 139 134*  K 2.9* 3.6 3.0* 3.2* 3.7  CL 103  --  102 101 101  CO2 26  --  24 28 24   GLUCOSE 110*  --  154* 134* 114*  BUN 10  --  11 13 7   CALCIUM 7.6*  --  8.3* 8.1* 8.0*  CREATININE 0.51*  --  0.65 0.67 0.54*  GFRNONAA >60  --  >60 >60 >60  GFRAA >60  --  >60 >60 >60    LIVER FUNCTION TESTS: Recent Labs    02/02/17 0700  BILITOT 1.3*  AST 35  ALT 6*  ALKPHOS 140*  PROT 6.1*  ALBUMIN 2.4*    Assessment and Plan: 1. Dysphagia  Plan to place a new g-tube.  Inflating  the stomach this soon after the last tube was removed can be problematic for reperforation, but his repeat CT scan shows no evidence of leakage, abscess, etc from prior g-tube site.  We will tentatively try to place this on 1/31 if able, if not then on 2/1.  Will call daughter to obtain consent.  Electronically Signed: Letha Cape 02/28/2017, 12:29 PM   I spent a total of 25 Minutes at the the patient's bedside AND on the patient's hospital floor or unit, greater than 50% of which was counseling/coordinating care for dysphagia

## 2017-03-01 ENCOUNTER — Other Ambulatory Visit (HOSPITAL_COMMUNITY): Payer: Self-pay

## 2017-03-01 LAB — CBC
HEMATOCRIT: 33.2 % — AB (ref 39.0–52.0)
Hemoglobin: 9.9 g/dL — ABNORMAL LOW (ref 13.0–17.0)
MCH: 27.7 pg (ref 26.0–34.0)
MCHC: 29.8 g/dL — ABNORMAL LOW (ref 30.0–36.0)
MCV: 93 fL (ref 78.0–100.0)
Platelets: 210 10*3/uL (ref 150–400)
RBC: 3.57 MIL/uL — AB (ref 4.22–5.81)
RDW: 16 % — ABNORMAL HIGH (ref 11.5–15.5)
WBC: 7 10*3/uL (ref 4.0–10.5)

## 2017-03-01 LAB — PROTIME-INR
INR: 1.15
Prothrombin Time: 14.6 seconds (ref 11.4–15.2)

## 2017-03-04 LAB — BASIC METABOLIC PANEL
ANION GAP: 11 (ref 5–15)
BUN: 12 mg/dL (ref 6–20)
CALCIUM: 9.2 mg/dL (ref 8.9–10.3)
CHLORIDE: 96 mmol/L — AB (ref 101–111)
CO2: 29 mmol/L (ref 22–32)
CREATININE: 0.67 mg/dL (ref 0.61–1.24)
GFR calc non Af Amer: >60 mL/min (ref >60)
Glucose, Bld: 104 mg/dL — ABNORMAL HIGH (ref 65–99)
Potassium: 4 mmol/L (ref 3.5–5.1)
SODIUM: 136 mmol/L (ref 135–145)

## 2017-03-04 LAB — CBC
HCT: 35.9 % — ABNORMAL LOW (ref 39.0–52.0)
HEMOGLOBIN: 10.7 g/dL — AB (ref 13.0–17.0)
MCH: 27.9 pg (ref 26.0–34.0)
MCHC: 29.8 g/dL — ABNORMAL LOW (ref 30.0–36.0)
MCV: 93.5 fL (ref 78.0–100.0)
PLATELETS: 191 10*3/uL (ref 150–400)
RBC: 3.84 MIL/uL — AB (ref 4.22–5.81)
RDW: 16.2 % — ABNORMAL HIGH (ref 11.5–15.5)
WBC: 4.9 10*3/uL (ref 4.0–10.5)

## 2017-03-04 LAB — MAGNESIUM: MAGNESIUM: 2 mg/dL (ref 1.7–2.4)

## 2017-03-05 ENCOUNTER — Other Ambulatory Visit: Payer: Self-pay

## 2017-03-08 MED ORDER — IOPAMIDOL (ISOVUE-300) INJECTION 61%
100.0000 mL | Freq: Once | INTRAVENOUS | Status: AC | PRN
Start: 2017-03-08 — End: 2017-03-08
  Administered 2017-03-08: 100 mL via INTRAVENOUS

## 2017-03-10 LAB — CBC WITH DIFFERENTIAL/PLATELET
Basophils Absolute: 0 10*3/uL (ref 0.0–0.1)
Basophils Relative: 0 %
EOS PCT: 1 %
Eosinophils Absolute: 0.1 10*3/uL (ref 0.0–0.7)
HCT: 35.2 % — ABNORMAL LOW (ref 39.0–52.0)
HEMOGLOBIN: 10.6 g/dL — AB (ref 13.0–17.0)
LYMPHS PCT: 18 %
Lymphs Abs: 1 10*3/uL (ref 0.7–4.0)
MCH: 27.9 pg (ref 26.0–34.0)
MCHC: 30.1 g/dL (ref 30.0–36.0)
MCV: 92.6 fL (ref 78.0–100.0)
MONOS PCT: 8 %
Monocytes Absolute: 0.4 10*3/uL (ref 0.1–1.0)
Neutro Abs: 3.8 10*3/uL (ref 1.7–7.7)
Neutrophils Relative %: 73 %
PLATELETS: 163 10*3/uL (ref 150–400)
RBC: 3.8 MIL/uL — AB (ref 4.22–5.81)
RDW: 15.8 % — ABNORMAL HIGH (ref 11.5–15.5)
WBC: 5.2 10*3/uL (ref 4.0–10.5)

## 2017-03-10 LAB — BASIC METABOLIC PANEL
Anion gap: 14 (ref 5–15)
BUN: 15 mg/dL (ref 6–20)
CHLORIDE: 97 mmol/L — AB (ref 101–111)
CO2: 25 mmol/L (ref 22–32)
CREATININE: 0.67 mg/dL (ref 0.61–1.24)
Calcium: 9.2 mg/dL (ref 8.9–10.3)
GFR calc Af Amer: >60 mL/min (ref >60)
GFR calc non Af Amer: >60 mL/min (ref >60)
GLUCOSE: 122 mg/dL — AB (ref 65–99)
POTASSIUM: 3.8 mmol/L (ref 3.5–5.1)
Sodium: 136 mmol/L (ref 135–145)

## 2019-07-15 IMAGING — DX DG ABDOMEN 1V
1 series · 1 of 1 positions shown · non-contrast
Comparison: Portable exam 8055 hours compared to the wall 4466
hours

CLINICAL DATA: Nasogastric tube placement

EXAM:
ABDOMEN - 1 VIEW

[abdomen]
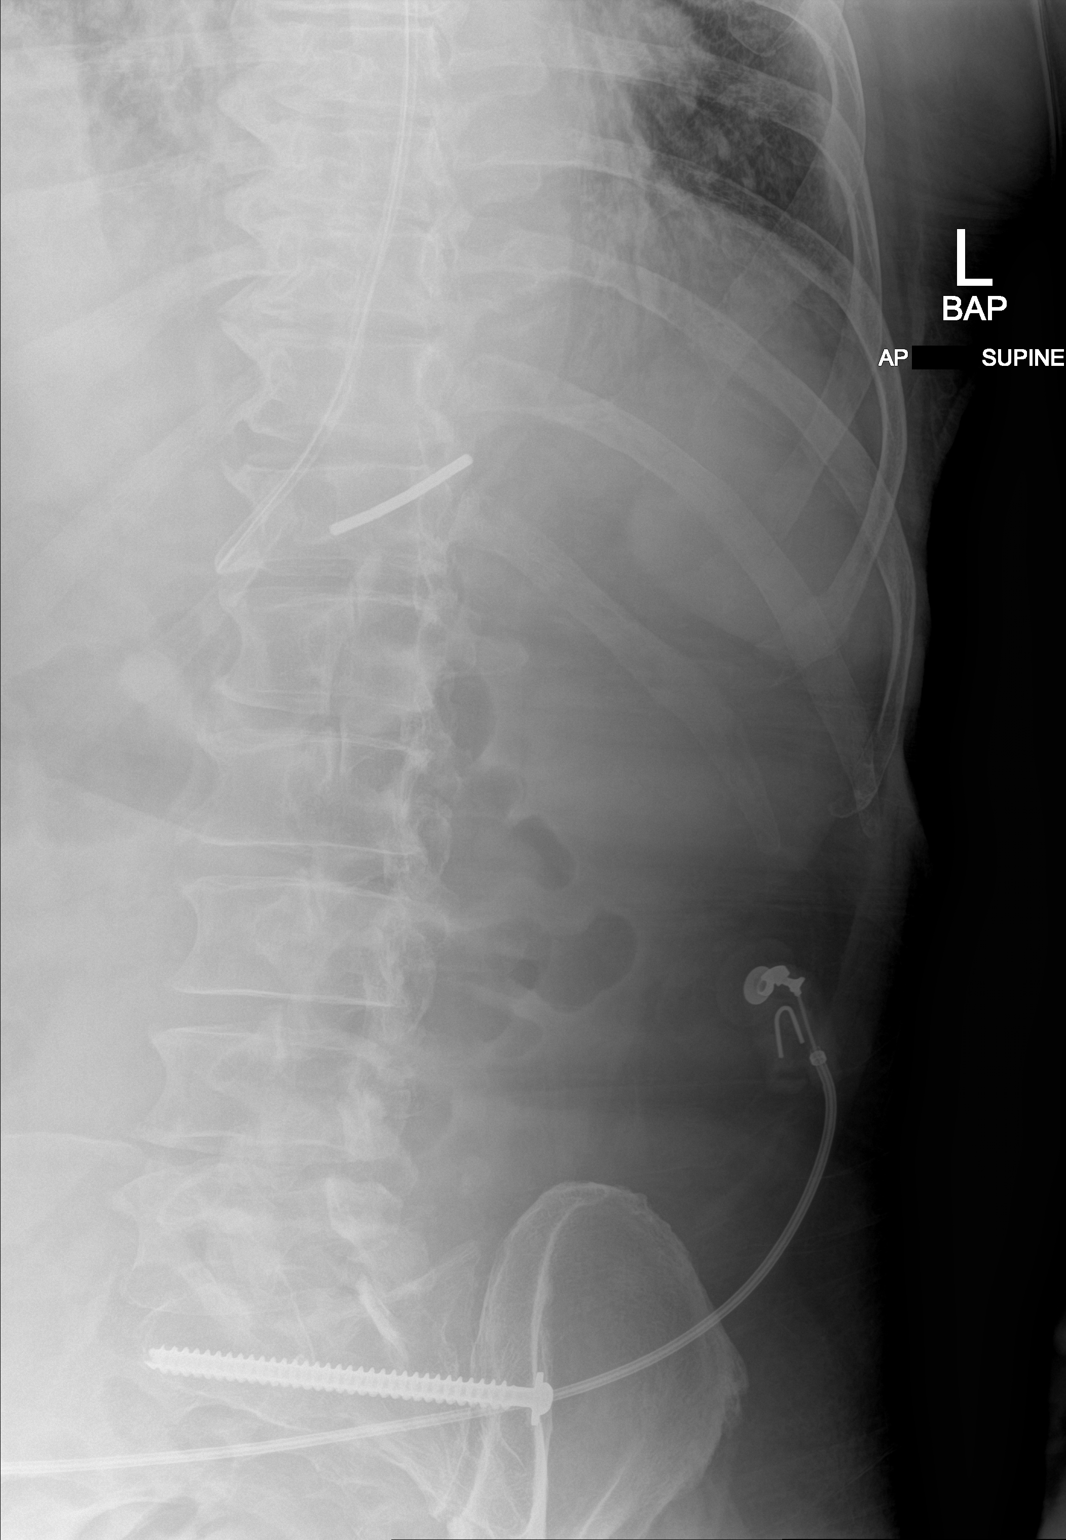

[1 of 1 positions shown; findings below may reference images not displayed]

FINDINGS: Tip feeding tube projects over proximal stomach.

Bowel gas pattern normal.

Prior pelvic fixation.
IMPRESSION: Tip of feeding tube projects over proximal stomach.

## 2019-07-22 IMAGING — DX DG ABD PORTABLE 1V
1 series · 1 of 1 positions shown · non-contrast
Comparison: February 07, 2017

CLINICAL DATA: Orogastric tube placement

EXAM:
PORTABLE ABDOMEN - 1 VIEW

[abdomen kub]
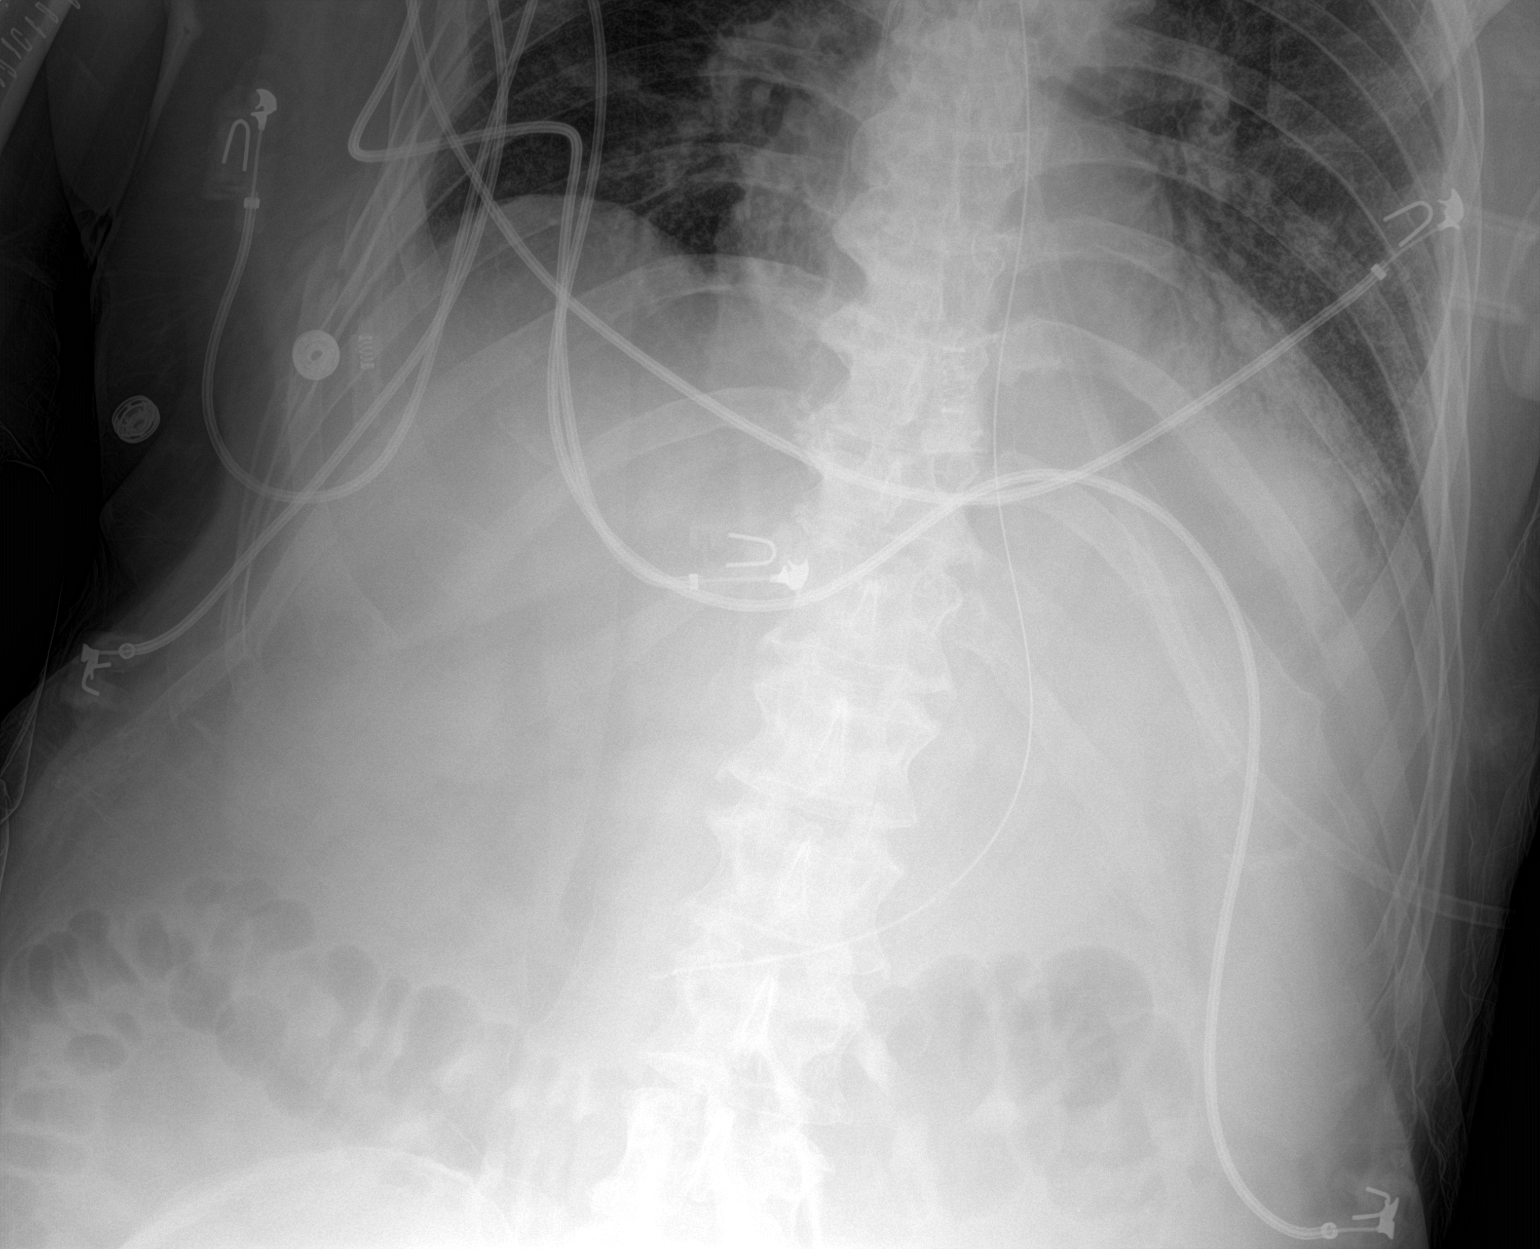

[1 of 1 positions shown; findings below may reference images not displayed]

FINDINGS: Orogastric tube tip and side port are in the mid the distal stomach.
Visualized bowel gas pattern is normal. No bowel obstruction or free
air evident. There is atelectatic change in the left lung base.
IMPRESSION: Orogastric tube tip and side-port in mid the distal stomach.
Visualized bowel gas pattern appears unremarkable. No free air.
Atelectasis left lung base.

## 2019-07-22 IMAGING — CR DG CHEST 1V PORT
1 series · 1 of 1 positions shown · non-contrast
Comparison: One-view chest x-ray 02/09/2016 one-view chest x-ray
02/06/2017.

CLINICAL DATA: Respiratory failure, acute.  Intubation.

EXAM:
PORTABLE CHEST 1 VIEW

[AP]
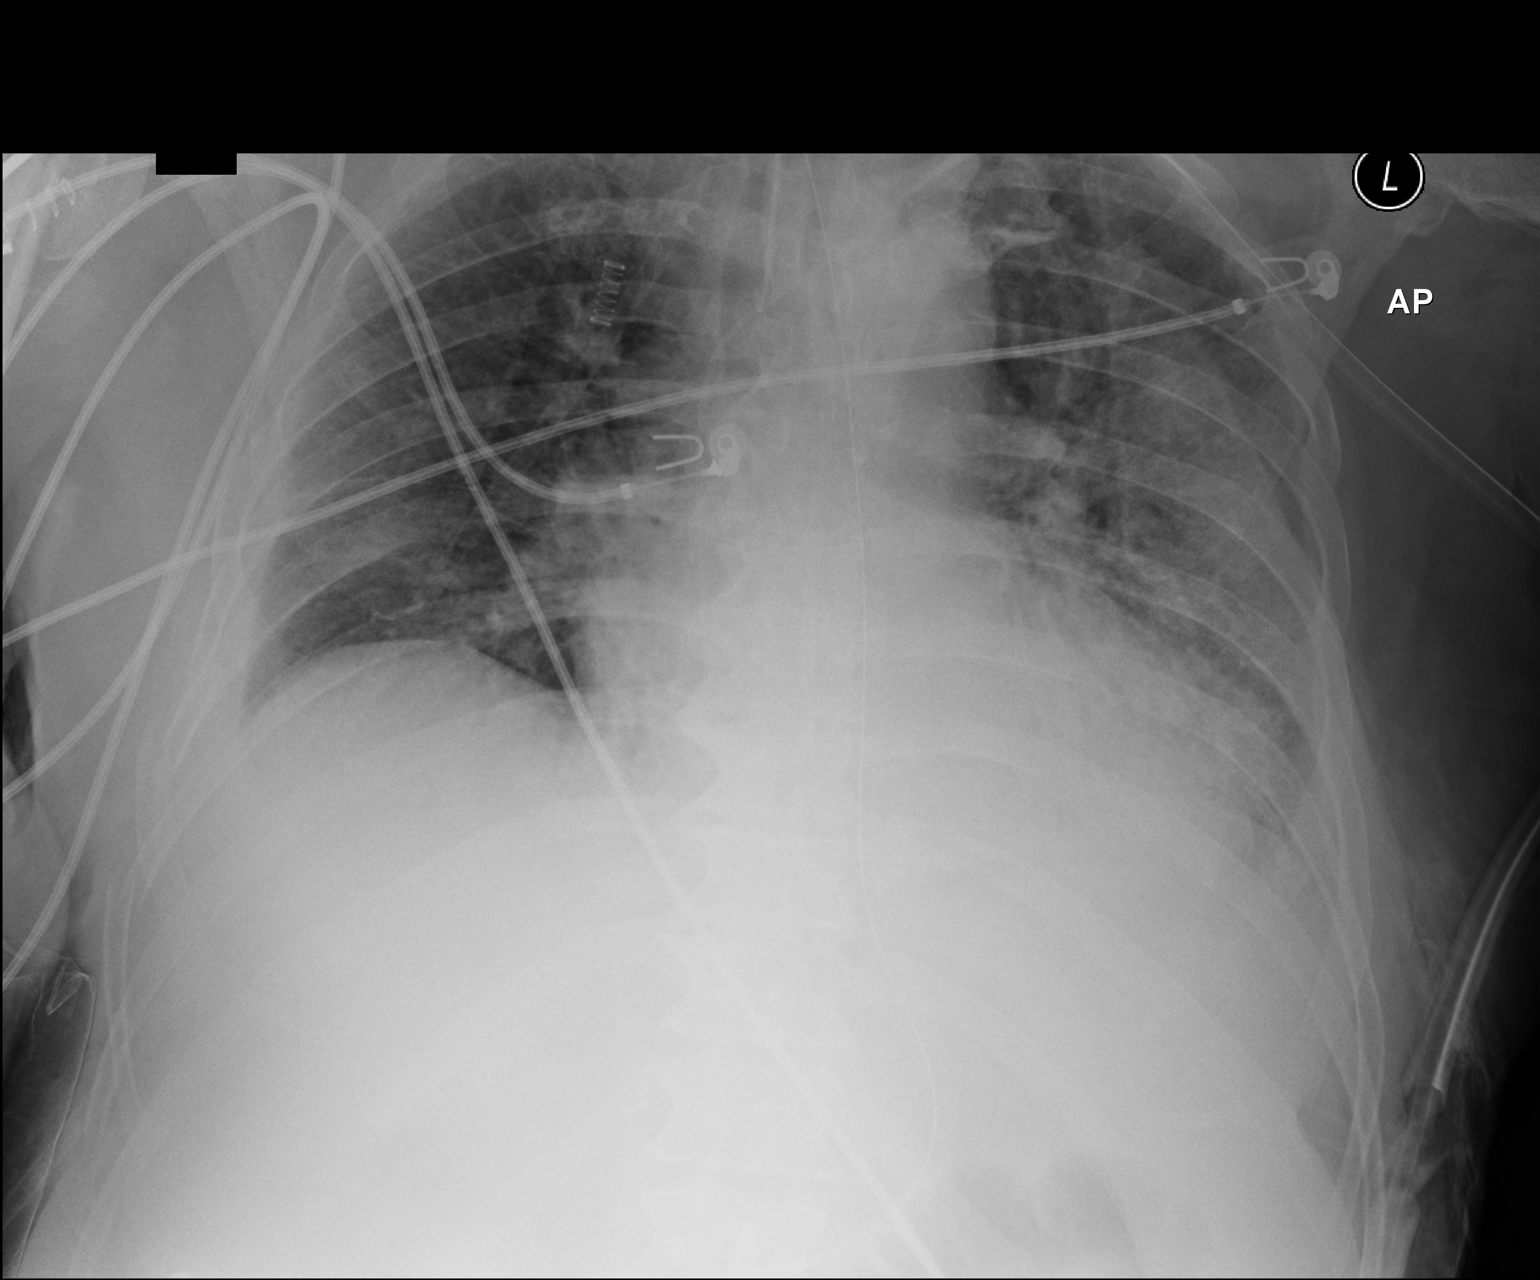

[1 of 1 positions shown; findings below may reference images not displayed]

FINDINGS: Heart size is exaggerated by low lung volumes. Endotracheal tube
terminates 2.2 cm above the carina. Left subclavian line is stable.
The NG tube courses off the inferior border of the film.

Interstitial and airspace disease remains, left greater than right.
Right pleural effusion is again noted. Multiple right-sided rib
fractures are again noted. There is no pneumothorax.
IMPRESSION: 1. Increasing interstitial and airspace disease, left greater than
right. This may represent edema, infection or aspiration is not
excluded.
2. The support apparatus is stable.

## 2019-07-26 IMAGING — CT CT ABDOMEN W/O CM
2 of 4 series · 14 of 46 positions shown, 16 images · non-contrast
Comparison: Abdominal radiograph-02/09/2017; 02/02/2017; chest
radiograph-02/09/2017

CLINICAL DATA: Dysphagia. Evaluate anatomy prior to potential
percutaneous gastrostomy tube placement.

EXAM:
CT ABDOMEN WITHOUT CONTRAST
TECHNIQUE: Multidetector CT imaging of the abdomen was performed following the
standard protocol without IV contrast.

[Series 3: ap without · axial · non-contrast · 0.80mm/px · z∈[+919,+1169]mm · 11 of 60 slices shown, 13 images]
[im 5/60  soft-tissue]
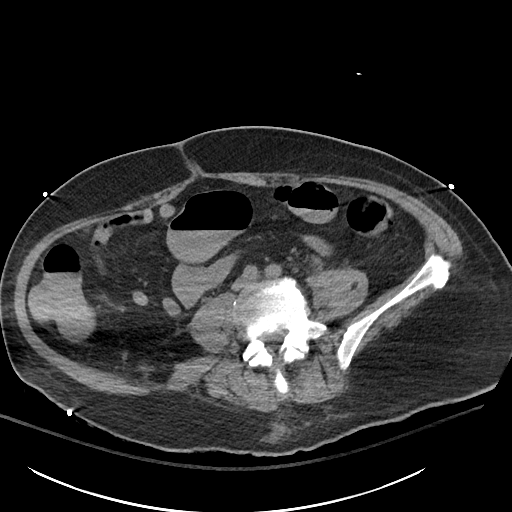
[im 5/60  bone]
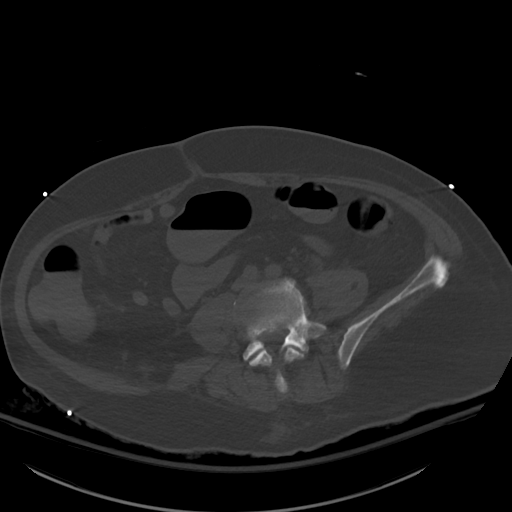
[im 10/60  soft-tissue]
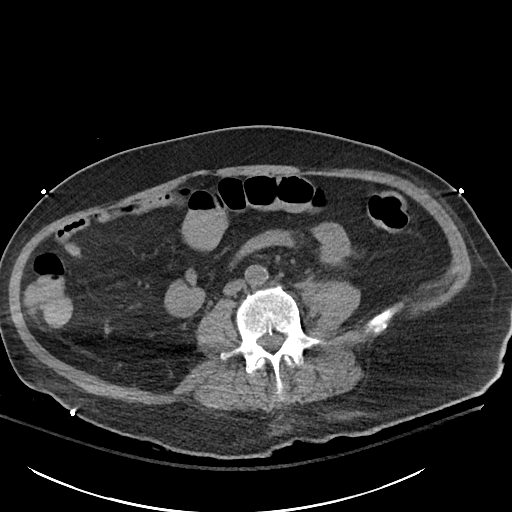
[im 15/60  soft-tissue]
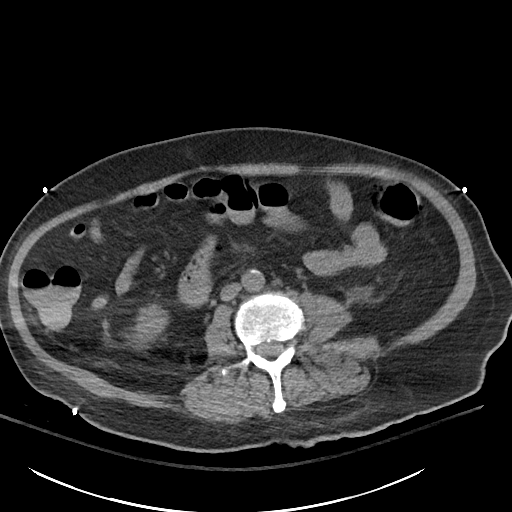
[im 20/60  soft-tissue]
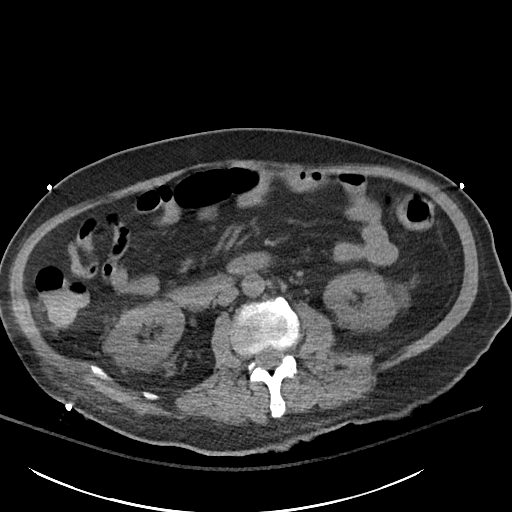
[im 25/60  soft-tissue]
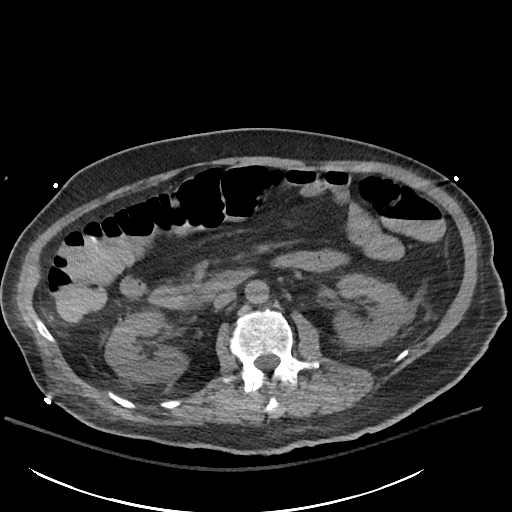
[im 30/60  soft-tissue]
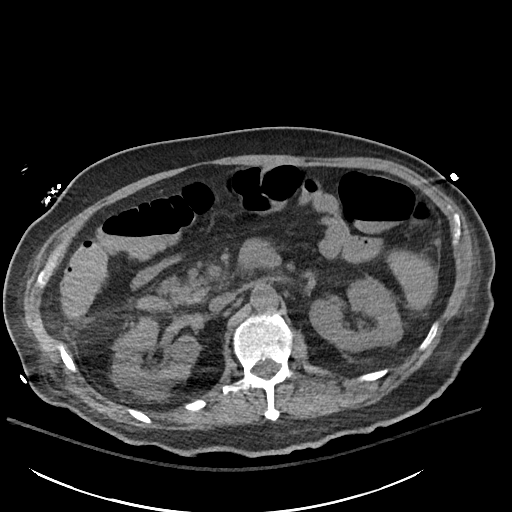
[im 35/60  soft-tissue]
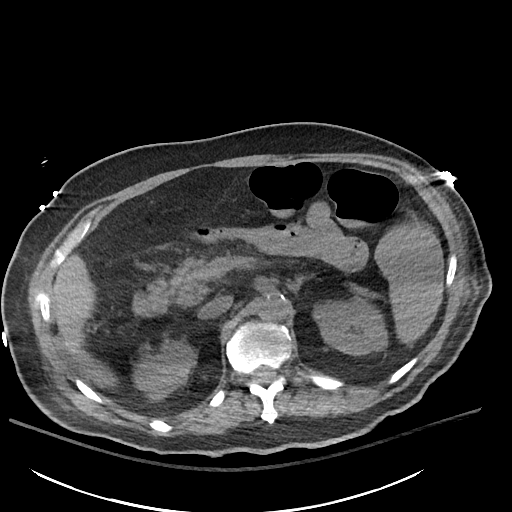
[im 40/60  soft-tissue]
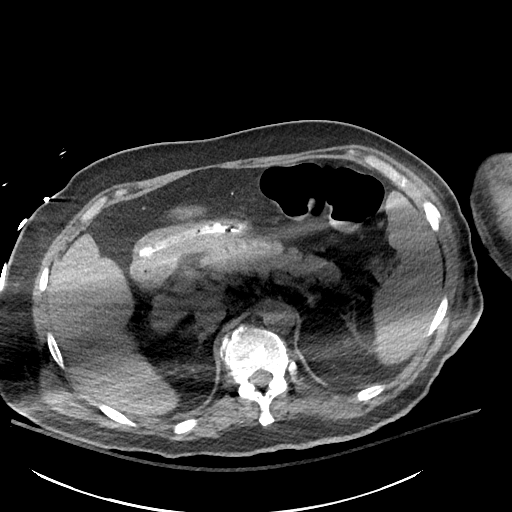
[im 45/60  soft-tissue]
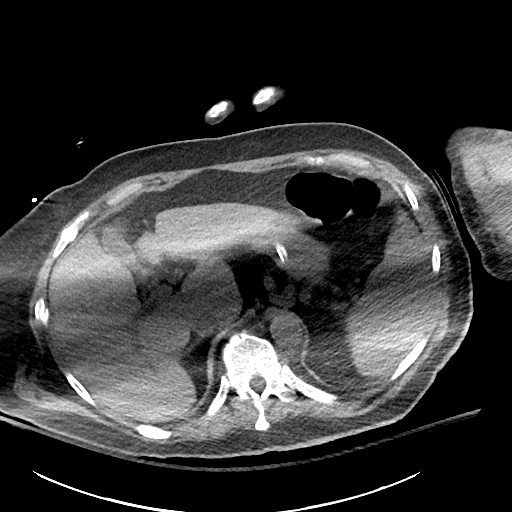
[im 45/60  bone]
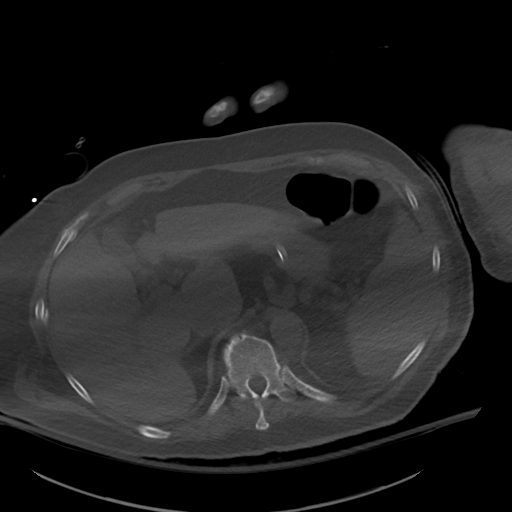
[im 50/60  soft-tissue]
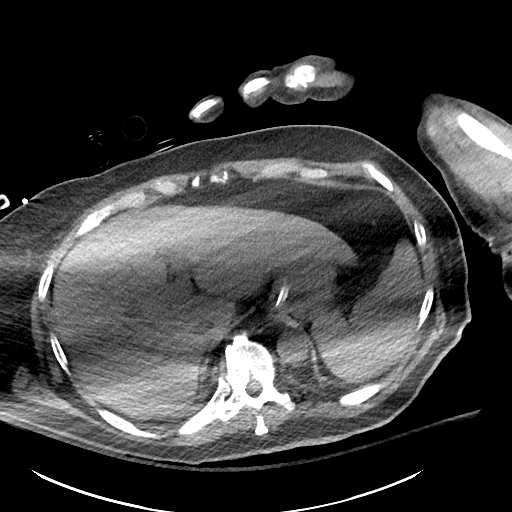
[im 55/60  soft-tissue]
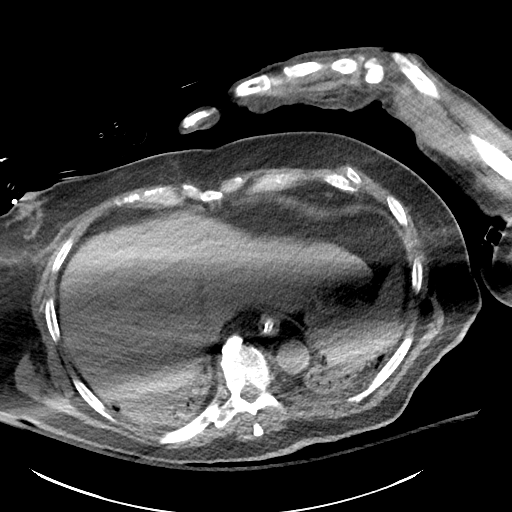

[Series 6: cor · coronal · 0.58mm/px · 3 of 86 slices shown]
[im 29/86  soft-tissue]
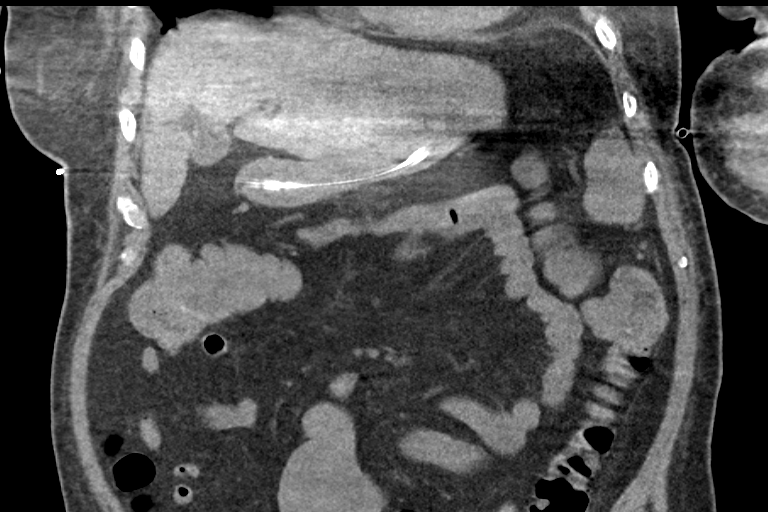
[im 38/86  soft-tissue]
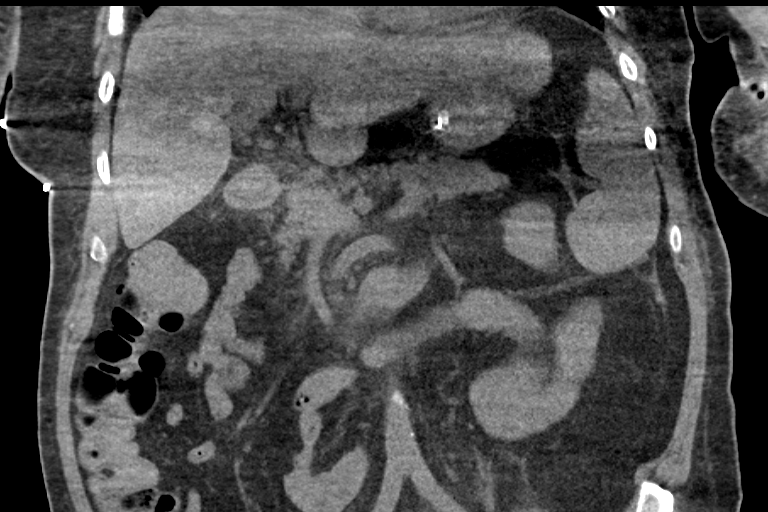
[im 48/86  soft-tissue]
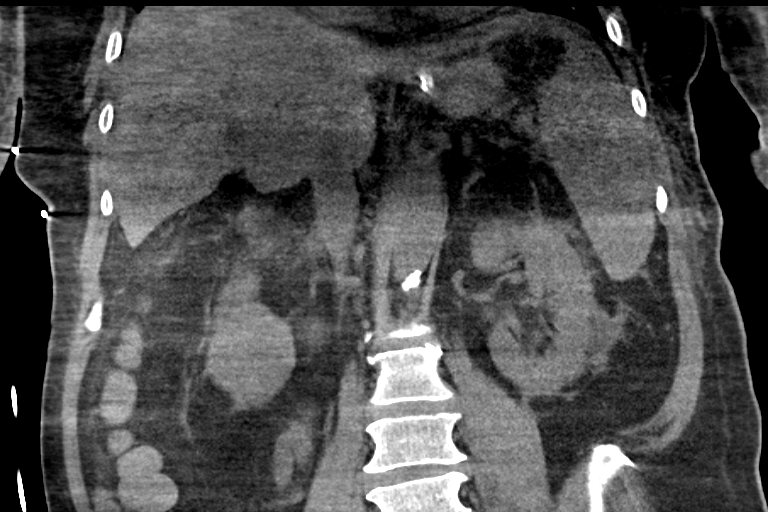

[14 of 46 positions shown; findings below may reference images not displayed]

FINDINGS: Lack of intravenous contrast limits the ability to evaluate solid
organs. Examination is further degraded secondary to patient motion
artifact as well as streak artifact associated with patient's
overlying left upper extremity.

Lower chest: Limited visualization of the lower thorax demonstrates
bibasilar consolidative opacities with associated air bronchograms.
No definite pleural effusion.

Hepatobiliary: Mild nodularity hepatic contour (representative image
12, series 3). There is a lenticular form punctate (approximately 4
mm) gallstone within a decompressed but otherwise normal-appearing
gallbladder. No definitive gallbladder wall thickening or
pericholecystic fluid. No ascites.

Pancreas: Normal noncontrast appearance of the pancreas

Spleen: Normal noncontrast appearance spleen

Adrenals/Urinary Tract: Normal noncontrast appearance the bilateral
kidneys. No renal stones. No renal stones are seen along the imaged
portions of either ureter. There is a minimal amount grossly
symmetric bilateral likely age and body habitus related perinephric
stranding. No urine obstruction.

Normal noncontrast appearance the bilateral adrenal glands.

Stomach/Bowel: Enteric tube tip terminates within the gastric antrum
with associated decompression the stomach. On the present
examination, the lateral segment of the left lobe of the liver as
well as the splenic flexure of the colon are interposed between the
anterior wall the stomach and ventral wall abdomen.

No evidence of enteric obstruction. No pneumoperitoneum, pneumatosis
or portal venous gas.

Vascular/Lymphatic: Atherosclerotic plaque within the abdominal
aorta.

No bulky retroperitoneal, mesenteric or porta hepatis adenopathy.

Other: Minimal subcutaneous edema about the midline of the back.

Musculoskeletal: There is a potentially acute minimally displaced
fracture involving the anterior aspect of the superior endplate of
the T12 vertebral body. No associated compression deformity.
Stigmata of DISH within the lower thoracic spine.

Minimally displaced bilateral rib fractures in various stages of
healing. The displaced fracture of the anterior aspect the left 6th
rib appears acute (image 4, series 3).
IMPRESSION: 1. On the present examination, both the lateral segment of the left
lobe of the liver as well as the splenic flexure of the colon are
interposed between the anterior wall the stomach and ventral wall
the abdomen however the stomach is decompressed - I am hopeful that
with gaseous distention of the stomach an adequate percutaneous
window may be achieved however would advise for ultrasound-guided
demarcation of the liver edge and would consider the administration
of barium the night preceding the planned procedure.
2. Potentially acute minimally displaced fracture involving the
anterior aspect of the superior endplate of the T12 vertebral body.
Correlation for point tenderness at this location is recommended.
3. Minimally displaced bilateral rib fractures in various stages of
healing however note, the displaced fracture involving the anterior
aspect the left sixth rib appears acute. Clinical correlation is
advised.
4. Consolidative opacities with associated air bronchograms within
the imaged bilateral lung bases, potentially atelectasis though
worrisome for multifocal infection/aspiration.

## 2023-08-31 DEATH — deceased
